# Patient Record
Sex: Female | Born: 1995 | Hispanic: No | Marital: Single | Smoking: Never smoker
Health system: Southern US, Community
[De-identification: ages and names within clinical notes are randomized; demographics above are authoritative.]

---

## 2019-04-23 ENCOUNTER — Emergency Department (HOSPITAL_BASED_OUTPATIENT_CLINIC_OR_DEPARTMENT_OTHER)
Admission: EM | Admit: 2019-04-23 | Discharge: 2019-04-23 | Disposition: A | Discharge status: Home / Self Care | Payer: Self-pay | Attending: Emergency Medicine | Admitting: Emergency Medicine

## 2019-04-23 ENCOUNTER — Encounter (HOSPITAL_BASED_OUTPATIENT_CLINIC_OR_DEPARTMENT_OTHER): Payer: Self-pay | Admitting: *Deleted

## 2019-04-23 ENCOUNTER — Other Ambulatory Visit: Payer: Self-pay

## 2019-04-23 ENCOUNTER — Emergency Department (HOSPITAL_BASED_OUTPATIENT_CLINIC_OR_DEPARTMENT_OTHER): Payer: Self-pay

## 2019-04-23 DIAGNOSIS — Y939 Activity, unspecified: Secondary | ICD-10-CM | POA: Insufficient documentation

## 2019-04-23 DIAGNOSIS — Y929 Unspecified place or not applicable: Secondary | ICD-10-CM | POA: Insufficient documentation

## 2019-04-23 DIAGNOSIS — Y990 Civilian activity done for income or pay: Secondary | ICD-10-CM | POA: Insufficient documentation

## 2019-04-23 DIAGNOSIS — S63501A Unspecified sprain of right wrist, initial encounter: Secondary | ICD-10-CM

## 2019-04-23 DIAGNOSIS — W010XXA Fall on same level from slipping, tripping and stumbling without subsequent striking against object, initial encounter: Secondary | ICD-10-CM | POA: Insufficient documentation

## 2019-04-23 DIAGNOSIS — S93401A Sprain of unspecified ligament of right ankle, initial encounter: Secondary | ICD-10-CM | POA: Insufficient documentation

## 2019-04-23 MED ORDER — ACETAMINOPHEN 500 MG PO TABS
1000.0000 mg | ORAL_TABLET | Freq: Once | ORAL | Status: AC
  Administered 2019-04-23: 1000 mg via ORAL
  Filled 2019-04-23: qty 2

## 2019-04-23 MED ORDER — HYDROCODONE-ACETAMINOPHEN 5-325 MG PO TABS: 1.0000 | ORAL_TABLET | Freq: Four times a day (QID) | ORAL | 0 refills | Status: AC | PRN

## 2019-04-23 NOTE — ED Provider Notes (Signed)
[facility] Provider Note: Marisa Spurling, MD, FACEP  CSN: [id number] MRN: [id number] ARRIVAL: 04/23/19 at [place]: [place]  Fall   HISTORY OF PRESENT ILLNESS  04/23/19 2:57 AM Marisa Kelley is a 23 y.o. female who slipped and fell at work just prior to arrival.  She fell onto her outstretched right arm.  She is complaining of pain in her right wrist and her right ankle.  She rates the pain as a 9 out of 10.  Pain is worse with movement or attempted weightbearing.  There is no associated deformity.  She did not hit her head and had no loss of consciousness.   History reviewed. No pertinent past medical history.  History reviewed. No pertinent surgical history.  No family history on file.  Social History   Tobacco Use  . Smoking status: Never Smoker  . Smokeless tobacco: Never Used  Substance Use Topics  . Alcohol use: Not on file  . Drug use: Never    Prior to Admission medications   Not on File    Allergies Patient has no known allergies.   REVIEW OF SYSTEMS  Negative except as noted here or in the History of Present Illness.   PHYSICAL EXAMINATION  Initial Vital Signs Blood pressure 137/89, pulse 86, temperature 98.2 F (36.8 C), temperature source Oral, resp. rate 16, height 5\' 2"  (1.575 m), weight 81.6 kg, last menstrual period 03/30/2019, SpO2 100 %.  Examination General: Well-developed, well-nourished female in no acute distress; appearance consistent with age of record HENT: normocephalic; atraumatic Eyes: pupils equal, round and reactive to light; extraocular muscles intact Neck: supple; no C-spine tenderness Heart: regular rate and rhythm Lungs: clear to auscultation bilaterally Chest: Nontender Abdomen: soft; nondistended; nontender; bowel sounds present Extremities: No deformity; tenderness of right wrist and right ankle without deformity or swelling, decreased range of motion, right hand and foot distally  neurovascularly intact with intact tendon function Neurologic: Awake, alert and oriented; motor function intact in all extremities and symmetric; no facial droop Skin: Warm and dry Psychiatric: Normal mood and affect   RESULTS  Summary of this visit's results, reviewed by myself:   EKG Interpretation  Date/Time:    Ventricular Rate:    PR Interval:    QRS Duration:   QT Interval:    QTC Calculation:   R Axis:     Text Interpretation:        Laboratory Studies: No results found for this or any previous visit (from the past 24 hour(s)). Imaging Studies: Dg Wrist Complete Right  Result Date: 04/23/2019 CLINICAL DATA:  23 y/o F; slipped and fall 1 hour ago. Right wrist and right ankle pain. EXAM: RIGHT WRIST - COMPLETE 3+ VIEW COMPARISON:  None. FINDINGS: There is no evidence of fracture or dislocation. There is no evidence of arthropathy or other focal bone abnormality. Soft tissues are unremarkable. IMPRESSION: Negative. Electronically Signed   By: Kristine Garbe M.D.   On: 04/23/2019 02:08   Dg Ankle Complete Right  Result Date: 04/23/2019 CLINICAL DATA:  Right ankle pain since a slip and fall 1 hour ago. Initial encounter. EXAM: RIGHT ANKLE - COMPLETE 3+ VIEW COMPARISON:  None. FINDINGS: There is no evidence of fracture, dislocation, or joint effusion. There is no evidence of arthropathy or other focal bone abnormality. Soft tissues are unremarkable. IMPRESSION: Negative exam. Electronically Signed   By: Inge Rise M.D.   On: 04/23/2019 02:07    ED COURSE and MDM  Nursing notes and initial vitals signs, including pulse oximetry, reviewed.  Vitals:   04/23/19 0135 04/23/19 0137  BP:  137/89  Pulse:  86  Resp:  16  Temp:  98.2 F (36.8 C)  TempSrc:  Oral  SpO2:  100%  Weight: 81.6 kg   Height: 5\' 2"  (1.575 m)    Suspect sprain of right wrist and ankle.  Will splint and refer to orthopedics if symptoms do not improve.  PROCEDURES    ED DIAGNOSES      ICD-10-CM   1. Fall from slipping on wet surface, initial encounter  W01.0XXA   2. Sprain of right wrist, initial encounter  S63.501A   3. Sprain of right ankle, unspecified ligament, initial encounter  S93.401A        Paula LibraMolpus, Nyzaiah Kai, MD 04/23/19 [phone]

## 2019-04-23 NOTE — ED Triage Notes (Addendum)
Pt states she slipped and fell in water at work. She attempted to catch herself with her right arm. C/o right wrist pain and right ankle pain. No obvious pain but right wrist and right ankle are painful to move/touch. Denies hitting her head. No loc. States she does not need a urine drug screen for work

## 2019-04-23 NOTE — ED Notes (Signed)
PMS intact before and after. Pt tolerated well. All questions answered. 

## 2021-02-04 IMAGING — DX RIGHT WRIST - COMPLETE 3+ VIEW
4 series · 4 of 4 positions shown · non-contrast
Comparison: None.

CLINICAL DATA: 23 y/o F; slipped and fall 1 hour ago. Right wrist
and right ankle pain.

EXAM:
RIGHT WRIST - COMPLETE 3+ VIEW

[wrist ap (1 of 2)]
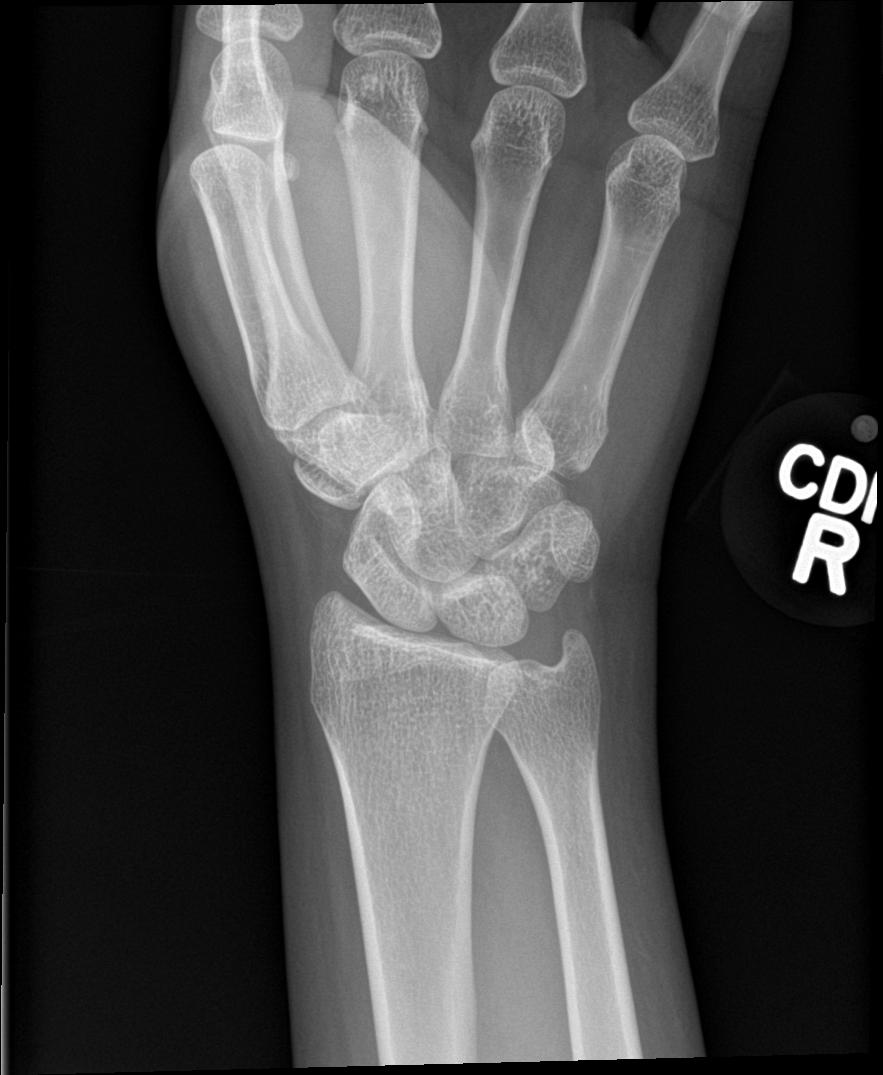

[wrist obl]
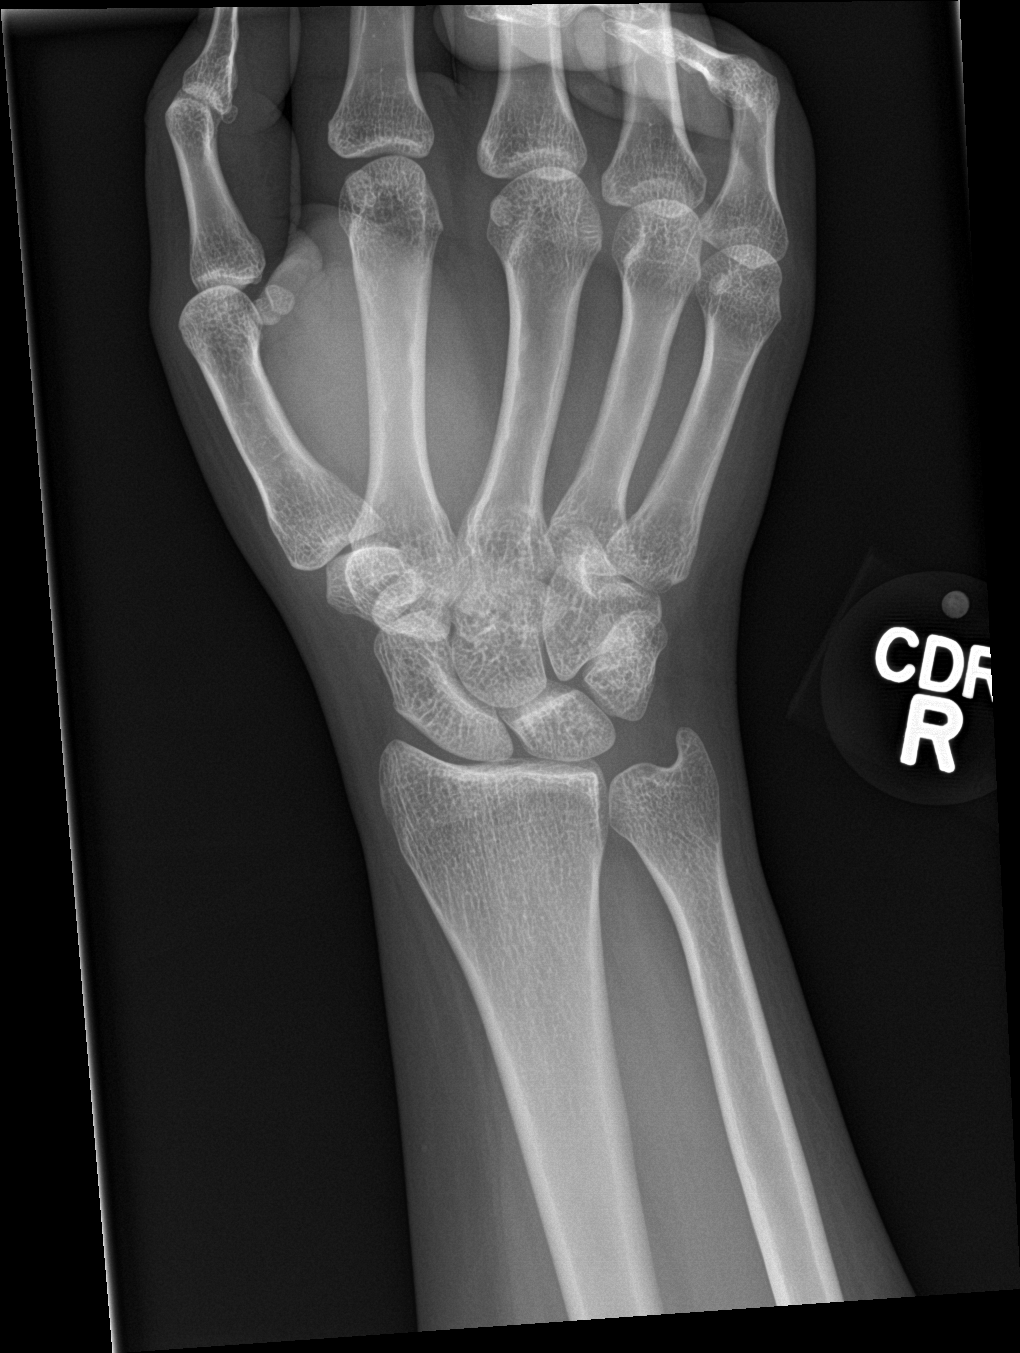

[wrist lat]
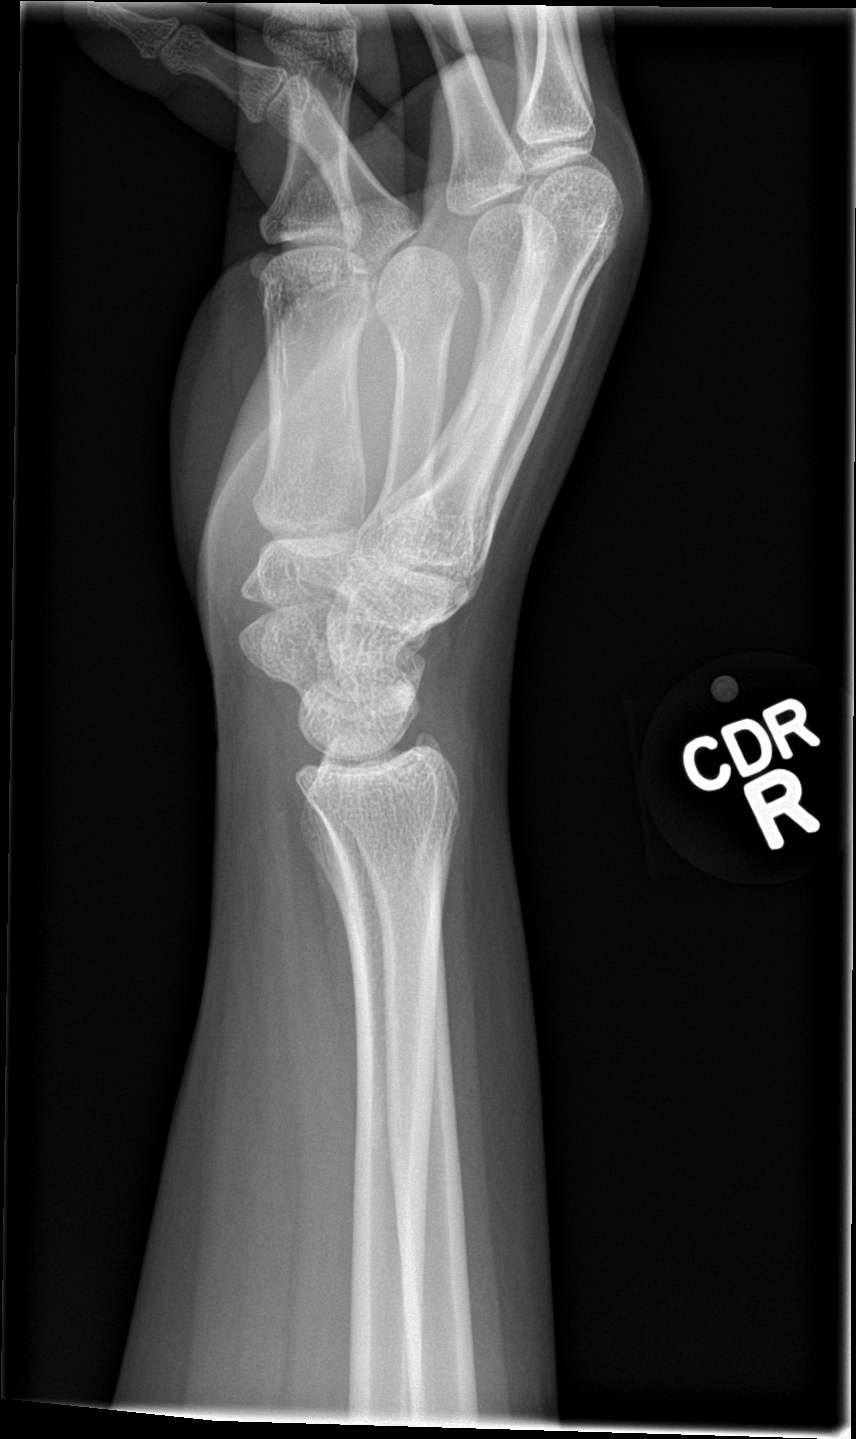

[wrist ap (2 of 2)]
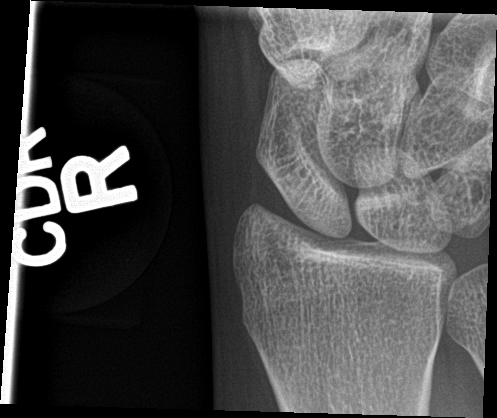

[4 of 4 positions shown; findings below may reference images not displayed]

FINDINGS: There is no evidence of fracture or dislocation. There is no
evidence of arthropathy or other focal bone abnormality. Soft
tissues are unremarkable.
IMPRESSION: Negative.

## 2021-02-04 IMAGING — DX RIGHT ANKLE - COMPLETE 3+ VIEW
3 series · 3 of 3 positions shown · non-contrast
Comparison: None.

CLINICAL DATA: Right ankle pain since a slip and fall 1 hour ago.
Initial encounter.

EXAM:
RIGHT ANKLE - COMPLETE 3+ VIEW

[ankle obl]
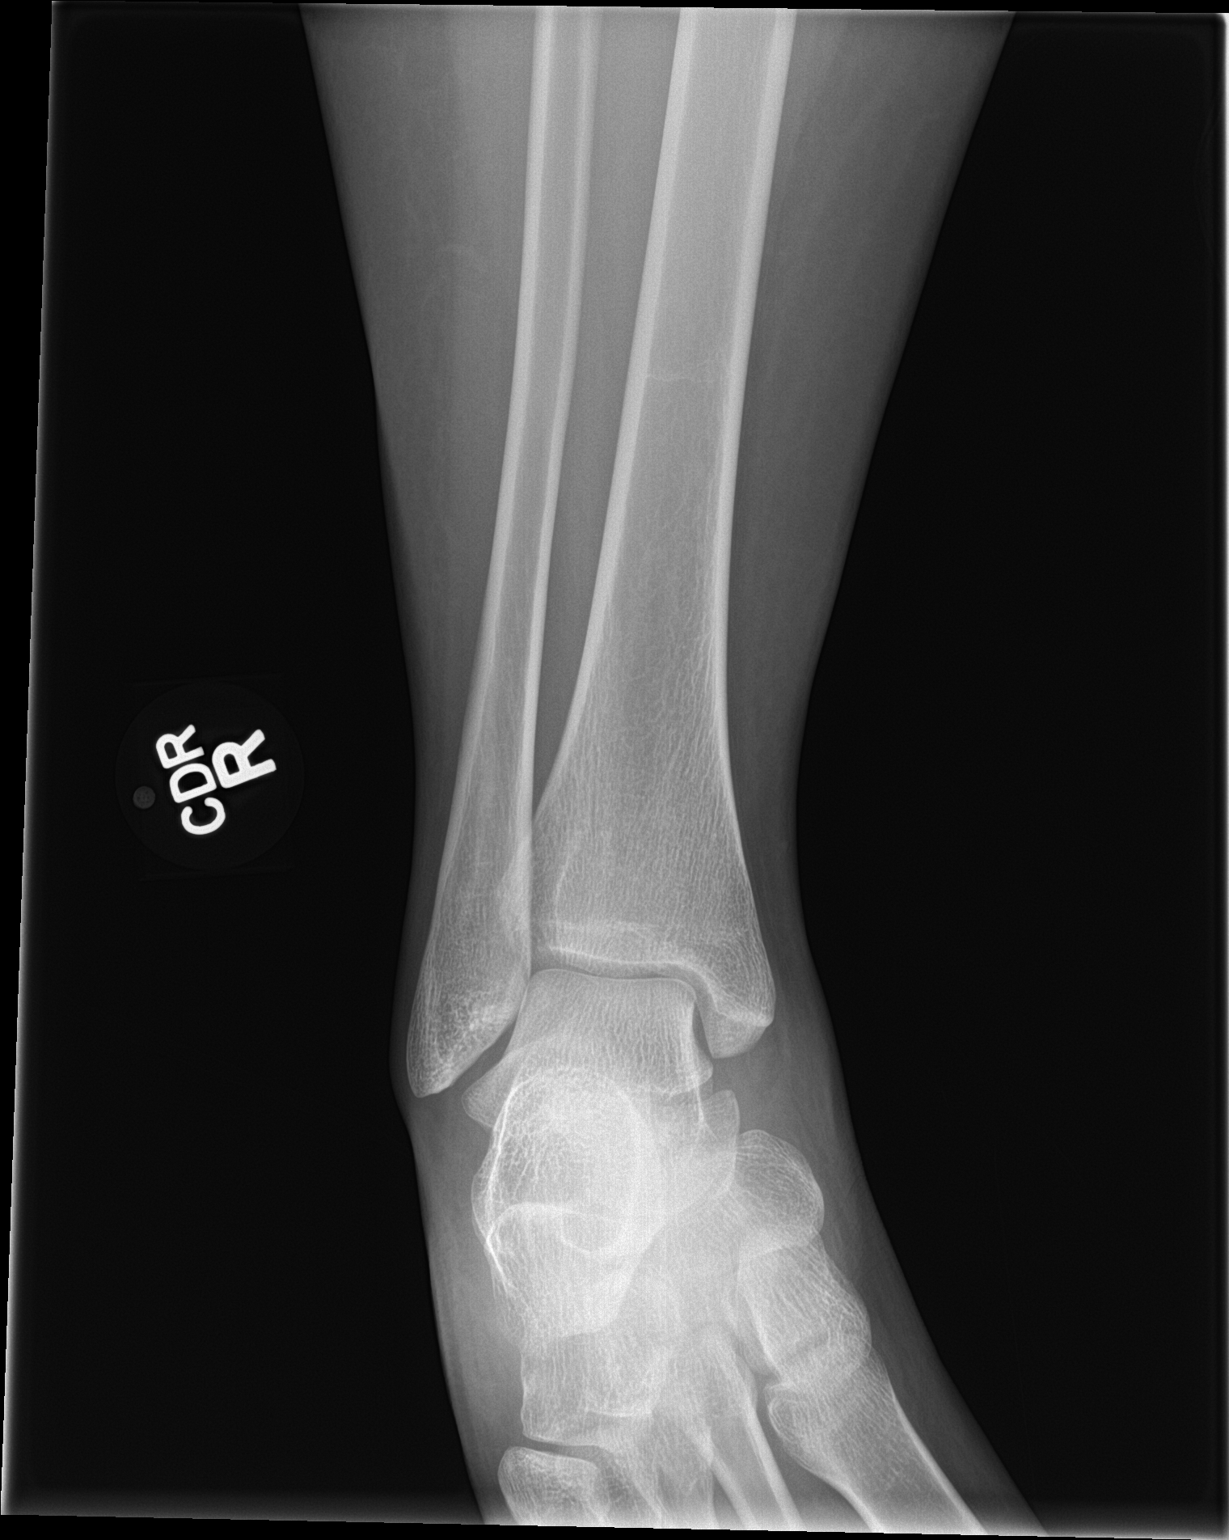

[ankle lat]
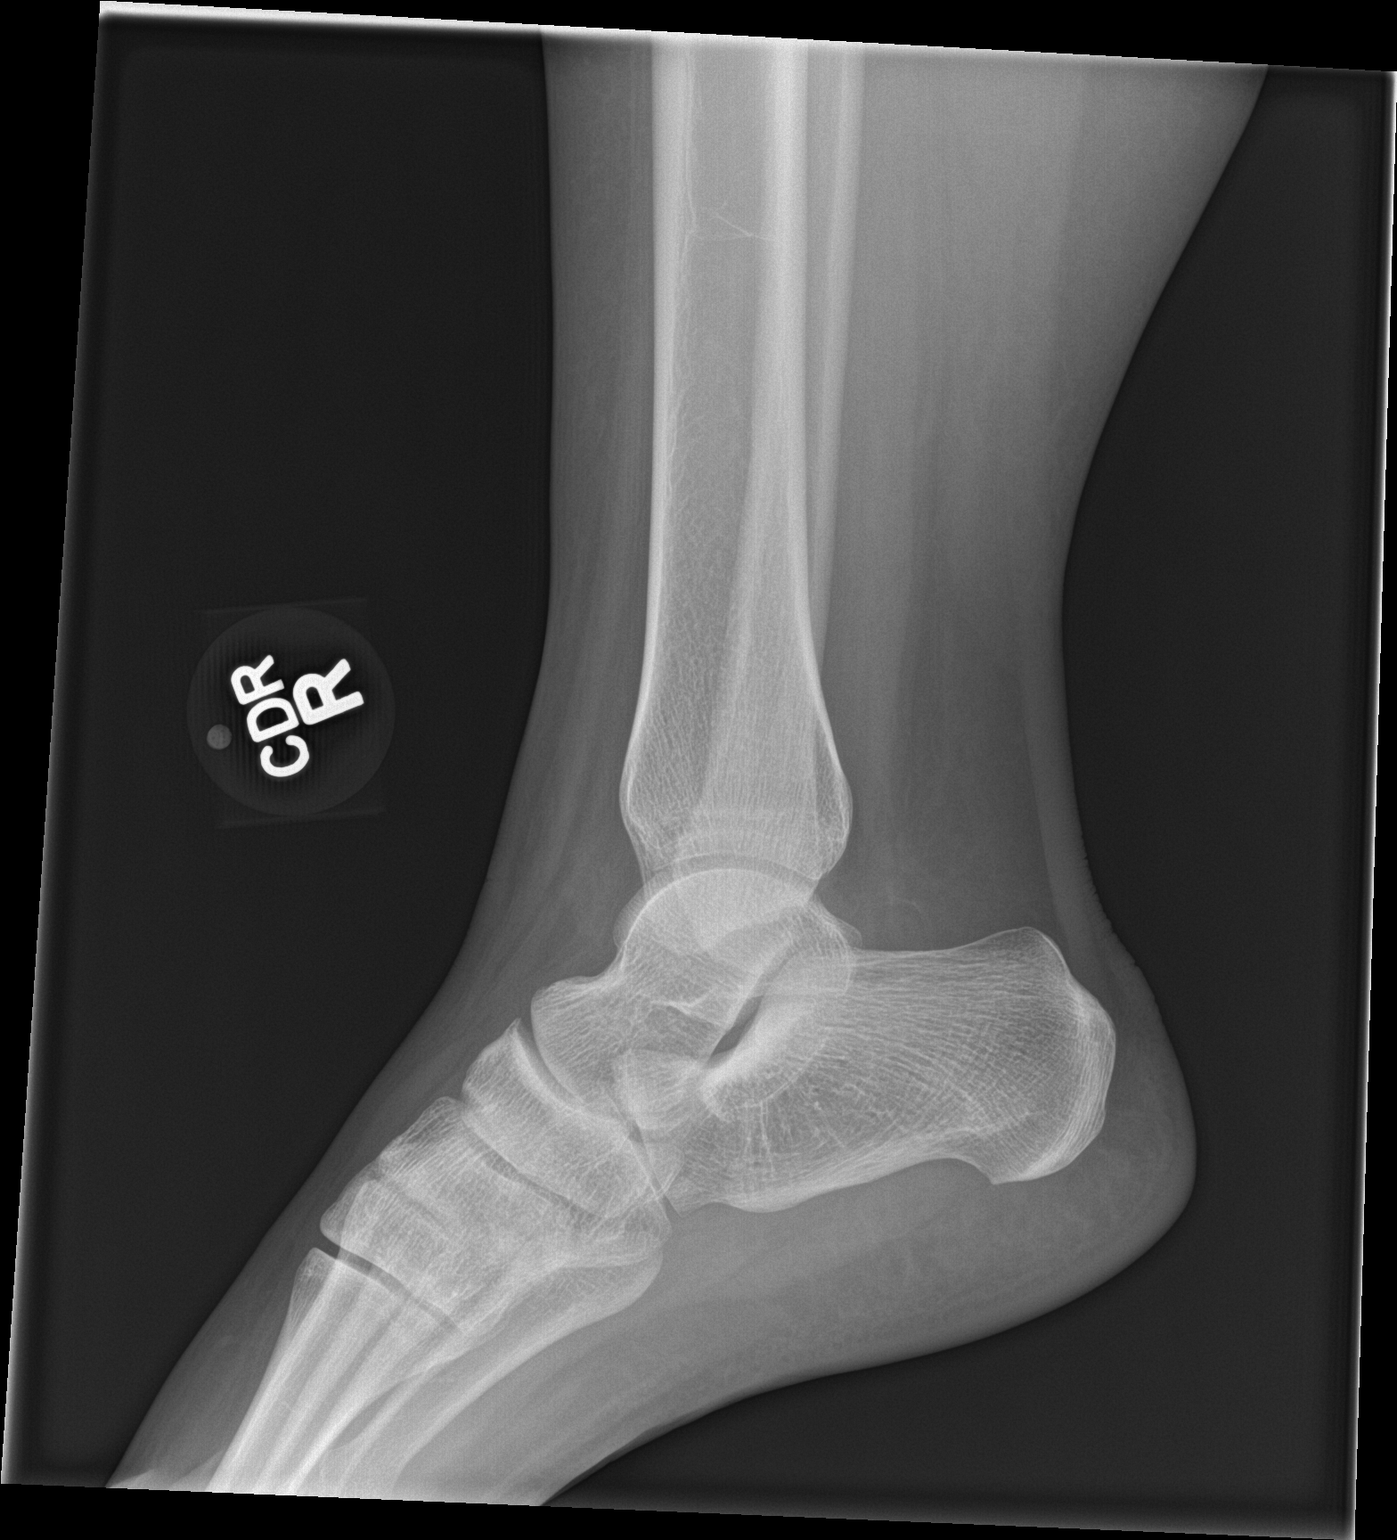

[ankle ap]
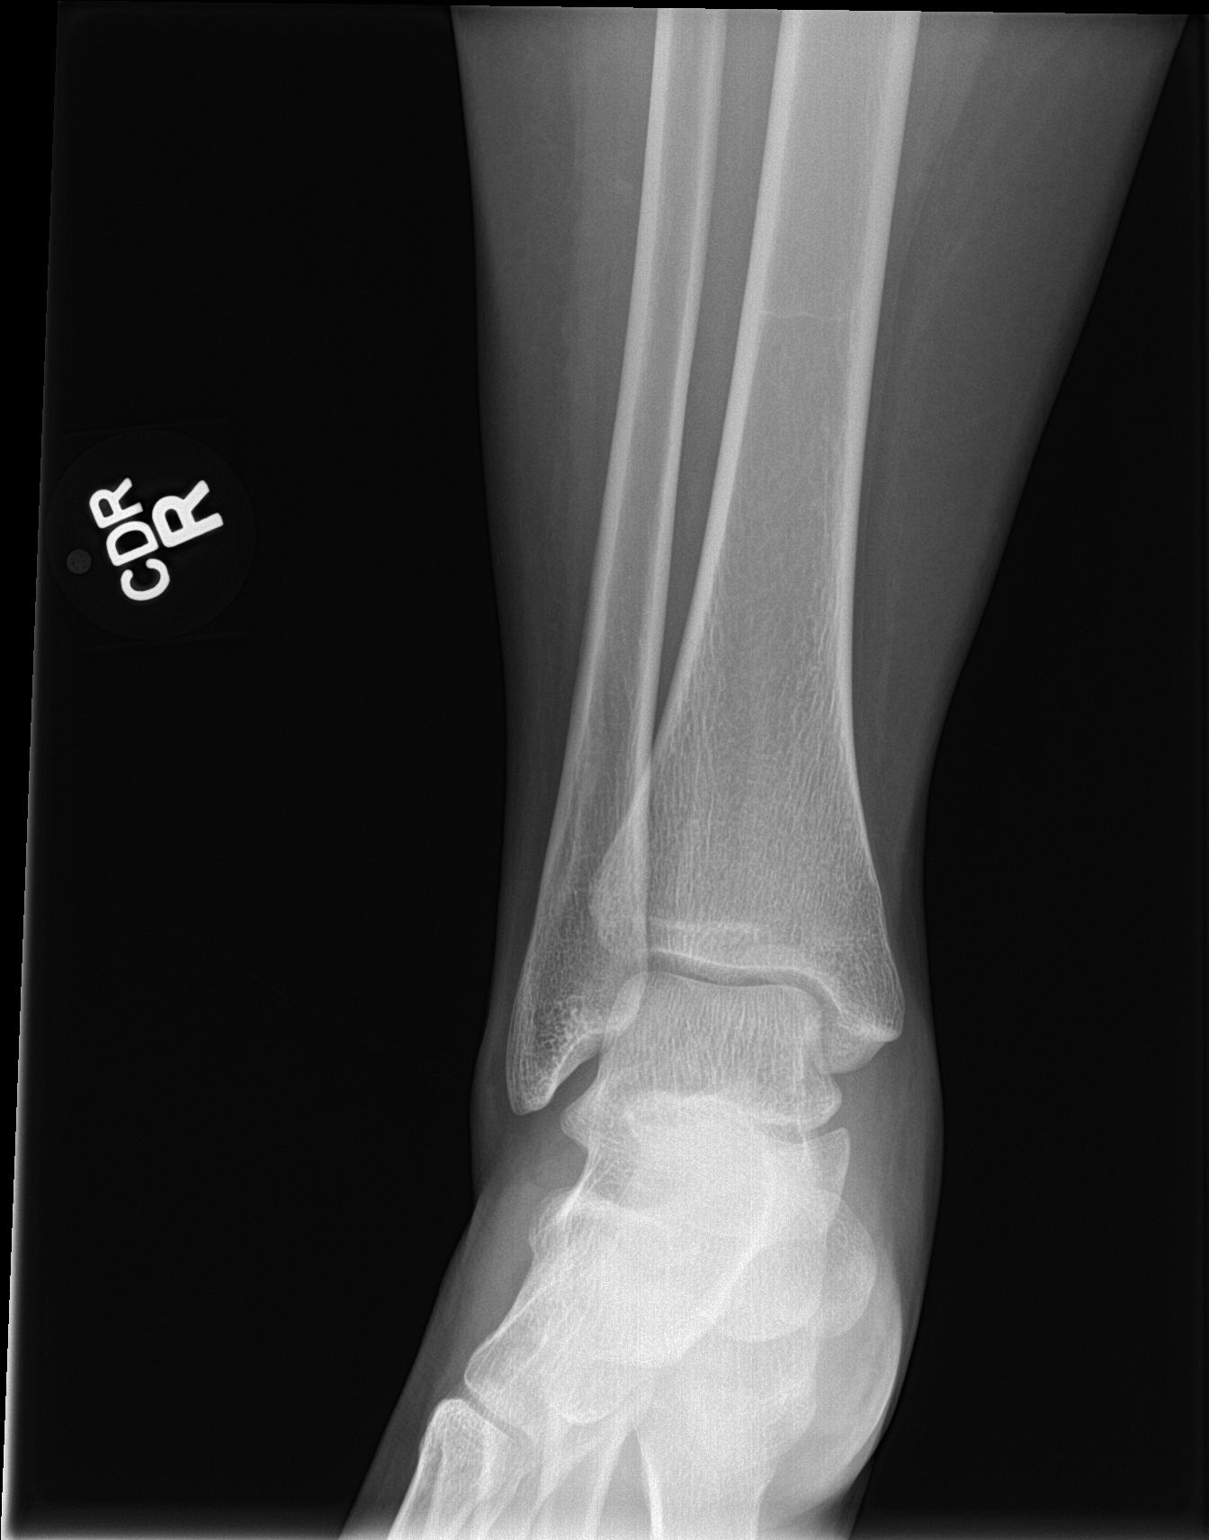

[3 of 3 positions shown; findings below may reference images not displayed]

FINDINGS: There is no evidence of fracture, dislocation, or joint effusion.
There is no evidence of arthropathy or other focal bone abnormality.
Soft tissues are unremarkable.
IMPRESSION: Negative exam.
